# Patient Record
Sex: Male | Born: 1980 | Race: Black or African American | Hispanic: No | Marital: Married | Smoking: Never smoker
Health system: Southern US, Community
[De-identification: ages and names within clinical notes are randomized; demographics above are authoritative.]

---

## 2009-12-12 ENCOUNTER — Emergency Department (HOSPITAL_COMMUNITY): Admission: EM | Admit: 2009-12-12 | Discharge: 2009-12-12 | Payer: Self-pay | Admitting: Emergency Medicine

## 2010-09-23 ENCOUNTER — Emergency Department (HOSPITAL_COMMUNITY)
Admission: EM | Admit: 2010-09-23 | Discharge: 2010-09-23 | Payer: Self-pay | Source: Home / Self Care | Admitting: Emergency Medicine

## 2014-11-15 ENCOUNTER — Encounter (HOSPITAL_COMMUNITY): Payer: Self-pay | Admitting: Emergency Medicine

## 2014-11-15 ENCOUNTER — Emergency Department (HOSPITAL_COMMUNITY)
Admission: EM | Admit: 2014-11-15 | Discharge: 2014-11-15 | Disposition: A | Discharge status: Home / Self Care | Payer: Self-pay | Attending: Emergency Medicine | Admitting: Emergency Medicine

## 2014-11-15 DIAGNOSIS — M62830 Muscle spasm of back: Secondary | ICD-10-CM | POA: Insufficient documentation

## 2014-11-15 DIAGNOSIS — S3992XA Unspecified injury of lower back, initial encounter: Secondary | ICD-10-CM | POA: Insufficient documentation

## 2014-11-15 DIAGNOSIS — Y92410 Unspecified street and highway as the place of occurrence of the external cause: Secondary | ICD-10-CM | POA: Insufficient documentation

## 2014-11-15 DIAGNOSIS — Y9389 Activity, other specified: Secondary | ICD-10-CM | POA: Insufficient documentation

## 2014-11-15 DIAGNOSIS — Y998 Other external cause status: Secondary | ICD-10-CM | POA: Insufficient documentation

## 2014-11-15 MED ORDER — NAPROXEN 500 MG PO TABS: 500.0000 mg | ORAL_TABLET | Freq: Two times a day (BID) | ORAL | Status: DC

## 2014-11-15 MED ORDER — METHOCARBAMOL 500 MG PO TABS: 1000.0000 mg | ORAL_TABLET | Freq: Four times a day (QID) | ORAL | Status: DC

## 2014-11-15 NOTE — Discharge Instructions (Signed)
Please read and follow all provided instructions.  Your diagnoses today include:  1. Spasm of back muscles     Tests performed today include:  Vital signs - see below for your results today  Medications prescribed:   Robaxin (methocarbamol) - muscle relaxer medication  DO NOT drive or perform any activities that require you to be awake and alert because this medicine can make you drowsy.    Naproxen - anti-inflammatory pain medication  Do not exceed 500mg  naproxen every 12 hours, take with food  You have been prescribed an anti-inflammatory medication or NSAID. Take with food. Take smallest effective dose for the shortest duration needed for your pain. Stop taking if you experience stomach pain or vomiting.   Take any prescribed medications only as directed.  Home care instructions:   Follow any educational materials contained in this packet  Please rest, use heat on your back for the next several days  Do not lift, push, pull anything more than 10 pounds for the next week  Follow-up instructions: Please follow-up with your primary care provider in the next 1 week for further evaluation of your symptoms.   Return instructions:  SEEK IMMEDIATE MEDICAL ATTENTION IF YOU HAVE:  New numbness, tingling, weakness, or problem with the use of your arms or legs  Severe back pain not relieved with medications  Loss control of your bowels or bladder  Increasing pain in any areas of the body (such as chest or abdominal pain)  Shortness of breath, dizziness, or fainting.   Worsening nausea (feeling sick to your stomach), vomiting, fever, or sweats  Any other emergent concerns regarding your health   Additional Information:  Your vital signs today were: BP 137/87 mmHg   Pulse 71   Temp(Src) 98.6 F (37 C) (Oral)   SpO2 97% If your blood pressure (BP) was elevated above 135/85 this visit, please have this repeated by your doctor within one month. --------------
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
## 2015-04-18 ENCOUNTER — Emergency Department (HOSPITAL_COMMUNITY)
Admission: EM | Admit: 2015-04-18 | Discharge: 2015-04-18 | Disposition: A | Discharge status: Home / Self Care | Payer: No Typology Code available for payment source | Attending: Emergency Medicine | Admitting: Emergency Medicine

## 2015-04-18 ENCOUNTER — Encounter (HOSPITAL_COMMUNITY): Payer: Self-pay | Admitting: Emergency Medicine

## 2015-04-18 DIAGNOSIS — M62838 Other muscle spasm: Secondary | ICD-10-CM

## 2015-04-18 DIAGNOSIS — S4991XA Unspecified injury of right shoulder and upper arm, initial encounter: Secondary | ICD-10-CM | POA: Insufficient documentation

## 2015-04-18 DIAGNOSIS — Y9389 Activity, other specified: Secondary | ICD-10-CM | POA: Diagnosis not present

## 2015-04-18 DIAGNOSIS — S29092A Other injury of muscle and tendon of back wall of thorax, initial encounter: Secondary | ICD-10-CM | POA: Insufficient documentation

## 2015-04-18 DIAGNOSIS — Y92410 Unspecified street and highway as the place of occurrence of the external cause: Secondary | ICD-10-CM | POA: Insufficient documentation

## 2015-04-18 DIAGNOSIS — S199XXA Unspecified injury of neck, initial encounter: Secondary | ICD-10-CM | POA: Diagnosis not present

## 2015-04-18 DIAGNOSIS — Y998 Other external cause status: Secondary | ICD-10-CM | POA: Diagnosis not present

## 2015-04-18 MED ORDER — IBUPROFEN 400 MG PO TABS
400.0000 mg | ORAL_TABLET | Freq: Once | ORAL | Status: AC
  Administered 2015-04-18: 400 mg via ORAL
  Filled 2015-04-18: qty 1

## 2015-04-18 MED ORDER — METHOCARBAMOL 500 MG PO TABS: 1000.0000 mg | ORAL_TABLET | Freq: Four times a day (QID) | ORAL | Status: DC | PRN
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
## 2015-04-18 NOTE — Discharge Instructions (Signed)
For pain control you may take up to 800mg  of Motrin (also known as ibuprofen). That is usually 4 over the counter pills,  3 times a day. Take with food to minimize stomach irritation   For breakthrough pain you may take Robaxin. Do not drink alcohol, drive or operate heavy machinery when taking Robaxin.  Do not hesitate to return to the emergency room for any new, worsening or concerning symptoms.  Please obtain primary care using resource guide below. Let them know that you were seen in the emergency room and that they will need to obtain records for further outpatient management.    Emergency Department Resource Guide 1) Find a Doctor and Pay Out of Pocket Although you won't have to find out who is covered by your insurance plan, it is a good idea to ask around and get recommendations. You will then need to call the office and see if the doctor you have chosen will accept you as a new patient and what types of options they offer for patients who are self-pay. Some doctors offer discounts or will set up payment plans for their patients who do not have insurance, but you will need to ask so you aren't surprised when you get to your appointment.  2) Contact Your Local Health Department Not all health departments have doctors that can see patients for sick visits, but many do, so it is worth a call to see if yours does. If you don't know where your local health department is, you can check in your phone book. The CDC also has a tool to help you locate your state's health department, and many state websites also have listings of all of their local health departments.  3) Find a Walk-in Clinic If your illness is not likely to be very severe or complicated, you may want to try a walk in clinic. These are popping up all over the country in pharmacies, drugstores, and shopping centers. They're usually staffed by nurse practitioners or physician assistants that have been trained to treat common illnesses  and complaints. They're usually fairly quick and inexpensive. However, if you have serious medical issues or chronic medical problems, these are probably not your best option.  No Primary Care Doctor: - Call Health Connect at  [phone] - they can help you locate a primary care doctor that  accepts your insurance, provides certain services, etc. - Physician Referral Service- [phone]  Chronic Pain Problems: Organization         Address  Phone   Notes  [facility]  [phone] Patients need to be referred by their primary care doctor.   Medication Assistance: Organization         Address  Phone   Notes  [facility] [address] [place], [place] [address] [phone] --Must be a resident of [place] -- Must have NO insurance coverage whatsoever (no Medicaid/ Medicare, etc.) -- The pt. MUST have a primary care doctor that directs their care regularly and follows them in the community   MedAssist  [phone]   [employer]  [phone]    Agencies that provide inexpensive medical care: Organization         Address  Phone   Notes  [facility]  [phone]   [facility]    [phone]   [facility] [address] [place], [place] [address] [phone]  [facility] [address] [place] [phone]   Planned Parenthood    [phone]   [facility]    [phone]   [place] and [place] [address], [place] Phone:  [phone], Fax:  [phone] Hours of Operation:  9 am - 6 pm, M-F.  Also accepts Medicaid/Medicare and self-pay.  [facility] for [place] [address], [address], [place] Phone: [phone], Fax: [phone]. Hours of Operation:  8:30 am - 5:30 pm, M-F.  Also accepts Medicaid and self-pay.  [facility] [place] [address] [place] Phone: [phone]   [place], [place], [place] [phone], Ext. [phone] Mondays & Thursdays: 7-9 AM.  First 15 patients are seen on a first come, first serve basis.    [place] Providers:  Organization         Address  Phone   Notes  [facility] [address], [place] [phone] Also accepts self-pay patients.  [facility] [address], [place]  [phone]   [place], [address], [place] [phone]   [facility] [address], [place] [phone]   Lucianne Lei [address], [place]   [phone] Only accepts [place] Access [place] patients after they have their name applied to their card.   Self-Pay (no insurance) in [facility] [place]:  Organization         Address  Phone   Notes  Sickle Cell Patients, [facility] [place] [phone]   [facility] [place] [phone]   [facility] [place]  [place], [place], [place] [phone]   [facility]/Dr. Osei-Bonsu  [address] [place] or [address], [place] [phone] Phone number for both [place] and [place] locations is the same.  Urgent Medical and [facility] [address] [place] [phone]   [facility] [address] [place] or [address] [phone]   [facility] [address], [place] [phone], phone; [phone], fax Sees patients 1st and 3rd Saturday of every month.  Must not qualify for public or private insurance (i.e. Medicaid, Medicare, [place] Health Choice, Veterans' Benefits)  Household income should be no more than 200% of the poverty level  The clinic cannot treat you if you are pregnant or think you are pregnant  Sexually transmitted diseases are not treated at the clinic.    Dental Care: Organization         Address  Phone  Notes  [facility] [place] [phone] Accepts children up to age 97 who are enrolled in [place] or [place]; pregnant women with a Medicaid card; and children who have applied for Medicaid or [place] Health Choice, but were declined, whose parents can pay a reduced fee at time of service.  [facility]  [address], [place] [phone] Accepts children up to age 11 who are enrolled in [place] or [place]; pregnant women with a Medicaid card; and  children who have applied for Medicaid or [place] Health Choice, but were declined, whose parents can pay a reduced fee at time of service.  [place] Adult [facility] Access PROGRAM  [place] [phone] Patients are seen by appointment only. Walk-ins are not accepted. [place] will see patients 65 years of age and older. Monday - Tuesday (8am-5pm) Most Wednesdays (8:30-5pm) $30 per visit, cash only  [facility] Access PRO[facility]  [address], [facility] [phone] Patients are seen by appointment only. Walk-ins are not accepted. [place] will see patients 17 years of age and older. One Wednesday Evening (Monthly: Volunteer Based).  $30 per visit, cash only  [place]  [phone] for adults; Children under age 53, call Graduate Pediatric Dentistry at [phone]. Children aged 31-14, please call [phone] to request a pediatric application.  Dental services are provided in all areas of dental care including fillings, crowns and bridges, complete and partial dentures, implants, gum treatment, root canals, and extractions. Preventive care is  also provided. Treatment is provided to both adults and children. Patients are selected via a lottery and there is often a waiting list.   [facility] [address] [place]  [phone] [email or web address]   [facility] [address] [place], [place] [phone], Ext. [phone] Second and Fourth Thursday of each month, opens at 6:30 AM; Clinic ends at 9 AM.  Patients are seen on a first-come first-served basis, and a limited number are seen during each clinic.   [facility]  [address] [place] D[place]er [place], [place] [phone]   Eligibility Requirements You must have lived in [place], [place], or [place] counties for at least the last three months.   You cannot be eligible for state or federal sponsored [employer], including [employer], [place], or [employer].   You generally cannot be eligible for healthcare insurance through your employer.    How to apply: Eligibility screenings are held every Tuesday and Wednesday afternoon from 1:00 pm until 4:00 pm. You do not need an appointment for the interview!  [facility] [address] [place], [place]   [place]  [place] Department  [place]  [phone]    Behavioral Health Resources in the Community: Intensive Outpatient Programs Organization         Address  Phone  Notes  [place]. [address] [place], [place] [phone]   [facility]patient [address], [place], [place]   ADS: Alcohol & Drug Svcs [address] [place], [place]   [place] [address],  [place], [place] or [phone]   Substance Abuse Resources Organization         Address  Phone  Notes  Alcohol and Drug Services  [phone]   [place]  [phone]   The [place]   Chinita Pester  [phone]   Residential & Outpatient Substance Abuse Program  [phone]   Psychological Services Organization         Address  Phone  Notes  [facility] [place]  [place]  [phone]   [place] [address], [place] or [phone]    Mobile Crisis Teams Organization  Address  Phone  Notes  [facility], Mobile Crisis Care Unit  [phone]   [facility]  [address] [place], [place] [phone]   [facility] De[facility] [address] [place] [phone]    Self-Help/Support Groups Organization         Address  Phone             Notes  [facility] of [place] - variety of support groups  [phone] Call for more information  Narcotics Anonymous (NA), [facility]aring [facility] [address], [place]  2 meetings at this location   Statisticianesidential Treatment Programs Organization         Address  Phone  Notes  [facility] [address],    [place]  [phone]   [facility]  [address], [place] [phone], [place], [place] [phone]   [facility] [address], [place] [phone] Admissions: 8am-3pm M-F  [facility] [address]    [place], [place] [phone]   [facility] [address], [place], [place] [phone]   [facility] [address]  [place], [place] [phone]   [facility] - Intensive Outpatient [address], [place], [place] [phone]   [facility] ([facility] Recovery [facility] [address]  [place], [place] [phone] or [phone]   [facility] (RTS) [address] [place], [place] [phone] Accepts Medicaid  [facility] [place] [address]  [place] [phone]  Substance Abuse/Addiction Treatment   [facility] Behavioral Health Resources Organization         Address  Phone  Notes  [facility]  [phone]) [phone]   Angie FavaJulie Brannon, PhD [address] [place], [place]   [phone] or [phone]) [phone]   [facility] [place]   [address] [place], [place] [phone](336) [phone]   [facility] [address], [place], [place] [phone]) [phone] Insurance/Medicaid/sponsorship through [facility]  [facility] Families [address]                                    [place], [place] [phone]) [phone] Therapy/tele-psych/case  [facility] [address].   [place], [place] [phone]) [phone]    Dr. Lolly MustacheArfeen  [phone]) [phone]   [facility] [place]  United Way [facility] 1) [address], [place] 2) [address], [place] 3)  [address], [place] [phone]  [phone](336) [phone]   [facility] Child Abuse Hotline [phone]) [phone] or [phone]) [phone] (After Hours)

## 2018-05-10 ENCOUNTER — Ambulatory Visit (HOSPITAL_COMMUNITY)
Admission: EM | Admit: 2018-05-10 | Discharge: 2018-05-10 | Disposition: A | Discharge status: Home / Self Care | Payer: Self-pay

## 2018-05-11 ENCOUNTER — Ambulatory Visit (HOSPITAL_COMMUNITY)
Admission: EM | Admit: 2018-05-11 | Discharge: 2018-05-11 | Disposition: A | Discharge status: Home / Self Care | Payer: Self-pay | Attending: Family Medicine | Admitting: Family Medicine

## 2018-05-11 ENCOUNTER — Encounter (HOSPITAL_COMMUNITY): Payer: Self-pay | Admitting: Emergency Medicine

## 2018-05-11 ENCOUNTER — Ambulatory Visit (INDEPENDENT_AMBULATORY_CARE_PROVIDER_SITE_OTHER): Payer: Self-pay

## 2018-05-11 DIAGNOSIS — M25561 Pain in right knee: Secondary | ICD-10-CM

## 2018-05-11 DIAGNOSIS — K0889 Other specified disorders of teeth and supporting structures: Secondary | ICD-10-CM

## 2018-05-11 MED ORDER — DICLOFENAC SODIUM 75 MG PO TBEC: 75.0000 mg | DELAYED_RELEASE_TABLET | Freq: Two times a day (BID) | ORAL | 0 refills | Status: DC

## 2018-05-11 MED ORDER — AMOXICILLIN 875 MG PO TABS: 875.0000 mg | ORAL_TABLET | Freq: Two times a day (BID) | ORAL | 0 refills | Status: AC

## 2018-05-11 NOTE — ED Triage Notes (Signed)
Pt states he fell off a ladder and landed hard on his R leg, pt c/o R shin, knee pain. Ambulatory.

## 2018-05-11 NOTE — ED Provider Notes (Signed)
[facility]   [id number] 05/11/18 Arrival Time: 1002  ASSESSMENT & PLAN:  1. Acute pain of right knee     Imaging: Dg Knee Complete 4 Views Right  Result Date: 05/11/2018 CLINICAL DATA:  Acute RIGHT knee pain following fall 3 days ago. Initial encounter. EXAM: RIGHT KNEE - COMPLETE 4+ VIEW COMPARISON:  None. FINDINGS: No evidence of fracture, dislocation, or joint effusion. No evidence of arthropathy or other focal bone abnormality. Soft tissues are unremarkable. IMPRESSION: Negative. Electronically Signed   By: Harmon Pier M.D.   On: 05/11/2018 10:48    Meds ordered this encounter  Medications  . diclofenac (VOLTAREN) 75 MG EC tablet    Sig: Take 1 tablet (75 mg total) by mouth 2 (two) times daily.    Dispense:  14 tablet    Refill:  0   ROM as tolerated. WBAT. Work note given.  Will see a dentist if dental pain not resolving with antibiotic.  Reviewed expectations re: course of current medical issues. Questions answered. Outlined signs and symptoms indicating need for more acute intervention. Patient verbalized understanding. After Visit Summary given.  SUBJECTIVE: History from: patient. Jose Espinoza is a 37 y.o. male who reports intermittent mild to moderate pain of his right lateral knee that is stable; described as aching without radiation; occasional sharp pains. Able to sleep through night but notices more pain when he bears weight in the morning. No feeling that knee is locking up or going to give out on him. Onset: abrupt, 3 days ago. Injury/trama: yes, reports slipping on ladder. Relieved by: resting. Worsened by: certain movements. Associated symptoms: none reported. Extremity sensation changes or weakness: none. Self treatment: occasional Tylenol and Advil with mild and temporary help. History of similar: no  Also reports intermittent R lower dental pain with occasional swelling and occasional drainage. "Starting to get sore again" over the past  few days. No drainage. Afebrile. Does not see dentist regularly. Normal PO intake. Advil helps some.  ROS: As per HPI.   OBJECTIVE:  Vitals:   05/11/18 1022  BP: 137/89  Pulse: (!) 55  Resp: 16  Temp: 99 F (37.2 C)  TempSrc: Oral  SpO2: 98%    General appearance: alert; no distress Teeth: some tenderness over R lower gum without area of fluctuance; no swelling Extremities: warm and well perfused; symmetrical with no gross deformities; localized tenderness over his right lateral knee with no swelling and no bruising; ROM: normal but with some discomfort; no knee instability or pops/clicks CV: normal extremity capillary refill Skin: warm and dry Neurologic: normal gait; normal symmetric reflexes in all extremities; normal sensation in all extremities Psychological: alert and cooperative; normal mood and affect  No Known Allergies   Social History   Socioeconomic History  . Marital status: Married    Spouse name: Not on file  . Number of children: Not on file  . Years of education: Not on file  . Highest education level: Not on file  Occupational History  . Not on file  Social Needs  . Financial resource strain: Not on file  . Food insecurity:    Worry: Not on file    Inability: Not on file  . Transportation needs:    Medical: Not on file    Non-medical: Not on file  Tobacco Use  . Smoking status: Never Smoker  Substance and Sexual Activity  . Alcohol use: Yes  . Drug use: No  . Sexual activity: Not on file  Lifestyle  .  Physical activity:    Days per week: Not on file    Minutes per session: Not on file  . Stress: Not on file  Relationships  . Social connections:    Talks on phone: Not on file    Gets together: Not on file    Attends religious service: Not on file    Active member of club or organization: Not on file    Attends meetings of clubs or organizations: Not on file    Relationship status: Not on file  . Intimate partner violence:    Fear of  current or ex partner: Not on file    Emotionally abused: Not on file    Physically abused: Not on file    Forced sexual activity: Not on file  Other Topics Concern  . Not on file  Social History Narrative  . Not on file   No family history on file. History reviewed. No pertinent surgical history.    Mardella Layman, MD 05/11/18 1112

## 2018-10-11 ENCOUNTER — Ambulatory Visit (HOSPITAL_COMMUNITY)
Admission: EM | Admit: 2018-10-11 | Discharge: 2018-10-11 | Disposition: A | Discharge status: Home / Self Care | Payer: Self-pay | Attending: Family Medicine | Admitting: Family Medicine

## 2018-10-11 ENCOUNTER — Encounter (HOSPITAL_COMMUNITY): Payer: Self-pay | Admitting: Emergency Medicine

## 2018-10-11 DIAGNOSIS — J40 Bronchitis, not specified as acute or chronic: Secondary | ICD-10-CM

## 2018-10-11 MED ORDER — PREDNISONE 50 MG PO TABS: 50.0000 mg | ORAL_TABLET | Freq: Every day | ORAL | 0 refills | Status: AC

## 2018-10-11 MED ORDER — AZITHROMYCIN 250 MG PO TABS: 250.0000 mg | ORAL_TABLET | Freq: Every day | ORAL | 0 refills | Status: DC

## 2018-10-11 NOTE — ED Provider Notes (Signed)
[facility]    CSN: [id number] Arrival date & time: 10/11/18  1033     History   Chief Complaint Chief Complaint  Patient presents with  . Cough    HPI Jose Espinoza is a 37 y.o. male.   37 year old male comes in for 2-week history of URI symptoms.  Has had productive cough, mild rhinorrhea/nasal congestion.  Has had cold sweats.  States when symptoms first started, had fever with T-max of 102, but has resolved for about a week.  He has felt dyspnea on exertion, with mild wheezing.  Denies chest pain, swelling of the legs, orthopnea.  OTC cold medication without relief.  Denies history of asthma.  Never smoker.     History reviewed. No pertinent past medical history.  There are no active problems to display for this patient.   History reviewed. No pertinent surgical history.     Home Medications    Prior to Admission medications   Medication Sig Start Date End Date Taking? Authorizing Provider  azithromycin (ZITHROMAX) 250 MG tablet Take 1 tablet (250 mg total) by mouth daily. Take first 2 tablets together, then 1 every day until finished. 10/11/18   Cathie Hoops, Devlyn Retter V, PA-C  diclofenac (VOLTAREN) 75 MG EC tablet Take 1 tablet (75 mg total) by mouth 2 (two) times daily. Patient not taking: Reported on 10/11/2018 05/11/18   Mardella Layman, MD  methocarbamol (ROBAXIN) 500 MG tablet Take 2 tablets (1,000 mg total) by mouth 4 (four) times daily as needed (Pain). Patient not taking: Reported on 05/11/2018 04/18/15   Pisciotta, Joni Reining, PA-C  predniSONE (DELTASONE) 50 MG tablet Take 1 tablet (50 mg total) by mouth daily. 10/11/18   Belinda Fisher, PA-C    Family History History reviewed. No pertinent family history.  Social History Social History   Tobacco Use  . Smoking status: Never Smoker  Substance Use Topics  . Alcohol use: Yes  . Drug use: No     Allergies   Patient has no known allergies.   Review of Systems Review of Systems  Reason unable to perform ROS:  See HPI as above.     Physical Exam Triage Vital Signs ED Triage Vitals  Enc Vitals Group     BP 10/11/18 1130 116/82     Pulse Rate 10/11/18 1130 97     Resp 10/11/18 1130 18     Temp 10/11/18 1130 98.3 F (36.8 C)     Temp Source 10/11/18 1130 Oral     SpO2 10/11/18 1130 98 %     Weight --      Height --      Head Circumference --      Peak Flow --      Pain Score 10/11/18 1132 6     Pain Loc --      Pain Edu? --      Excl. in GC? --    No data found.  Updated Vital Signs BP 116/82 (BP Location: Right Arm)   Pulse 97   Temp 98.3 F (36.8 C) (Oral)   Resp 18   SpO2 98%   Physical Exam  Constitutional: He is oriented to person, place, and time. He appears well-developed and well-nourished. No distress.  HENT:  Head: Normocephalic and atraumatic.  Right Ear: Tympanic membrane, external ear and ear canal normal. Tympanic membrane is not erythematous and not bulging.  Left Ear: Tympanic membrane, external ear and ear canal normal. Tympanic membrane is not erythematous and not  bulging.  Nose: Nose normal. Right sinus exhibits no maxillary sinus tenderness and no frontal sinus tenderness. Left sinus exhibits no maxillary sinus tenderness and no frontal sinus tenderness.  Mouth/Throat: Uvula is midline, oropharynx is clear and moist and mucous membranes are normal.  Eyes: Pupils are equal, round, and reactive to light. Conjunctivae are normal.  Neck: Normal range of motion. Neck supple.  Cardiovascular: Normal rate, regular rhythm and normal heart sounds. Exam reveals no gallop and no friction rub.  No murmur heard. Pulmonary/Chest: Effort normal. No accessory muscle usage. No respiratory distress.  Mild rhonchi on bilateral lower bases that resolved after cough.  Lymphadenopathy:    He has no cervical adenopathy.  Neurological: He is alert and oriented to person, place, and time.  Skin: Skin is warm and dry.  Psychiatric: He has a normal mood and affect. His behavior is  normal. Judgment normal.     UC Treatments / Results  Labs (all labs ordered are listed, but only abnormal results are displayed) Labs Reviewed - No data to display  EKG None  Radiology No results found.  Procedures Procedures (including critical care time)  Medications Ordered in UC Medications - No data to display  Initial Impression / Assessment and Plan / UC Course  I have reviewed the triage vital signs and the nursing notes.  Pertinent labs & imaging results that were available during my care of the patient were reviewed by me and considered in my medical decision making (see chart for details).    Will cover for bronchitis with prednisone and azithromycin.  Other symptomatic treatment discussed.  Push fluids.  Return precautions.  Patient expresses understanding and agrees to plan.  Final Clinical Impressions(s) / UC Diagnoses   Final diagnoses:  Bronchitis   ED Prescriptions    Medication Sig Dispense Auth. Provider   predniSONE (DELTASONE) 50 MG tablet Take 1 tablet (50 mg total) by mouth daily. 5 tablet Aizlynn Digilio V, PA-C   azithromycin (ZITHROMAX) 250 MG tablet Take 1 tablet (250 mg total) by mouth daily. Take first 2 tablets together, then 1 every day until finished. 6 tablet Threasa Alpha, PA-C 10/11/18 1215

## 2018-10-11 NOTE — Discharge Instructions (Signed)
Start azithromycin and prednisone as directed. Continue sudafed for nasal congestion/drainage. You can use over the counter nasal saline rinse such as neti pot and/or allergy medicine for nasal congestion. Keep hydrated, your urine should be clear to pale yellow in color. Tylenol/motrin for fever and pain. Monitor for any worsening of symptoms, chest pain, shortness of breath, wheezing, swelling of the throat, follow up for reevaluation.   For sore throat/cough try using a honey-based tea. Use 3 teaspoons of honey with juice squeezed from half lemon. Place shaved pieces of ginger into 1/2-1 cup of water and warm over stove top. Then mix the ingredients and repeat every 4 hours as needed.

## 2018-10-11 NOTE — ED Triage Notes (Signed)
Pt sts cough and URI sx x 2 weeks 

## 2019-04-11 IMAGING — DX DG KNEE COMPLETE 4+V*R*
4 series · 4 of 4 positions shown · non-contrast
Comparison: None.

CLINICAL DATA: Acute RIGHT knee pain following fall 3 days ago.
Initial encounter.

EXAM:
RIGHT KNEE - COMPLETE 4+ VIEW

[knee ap]
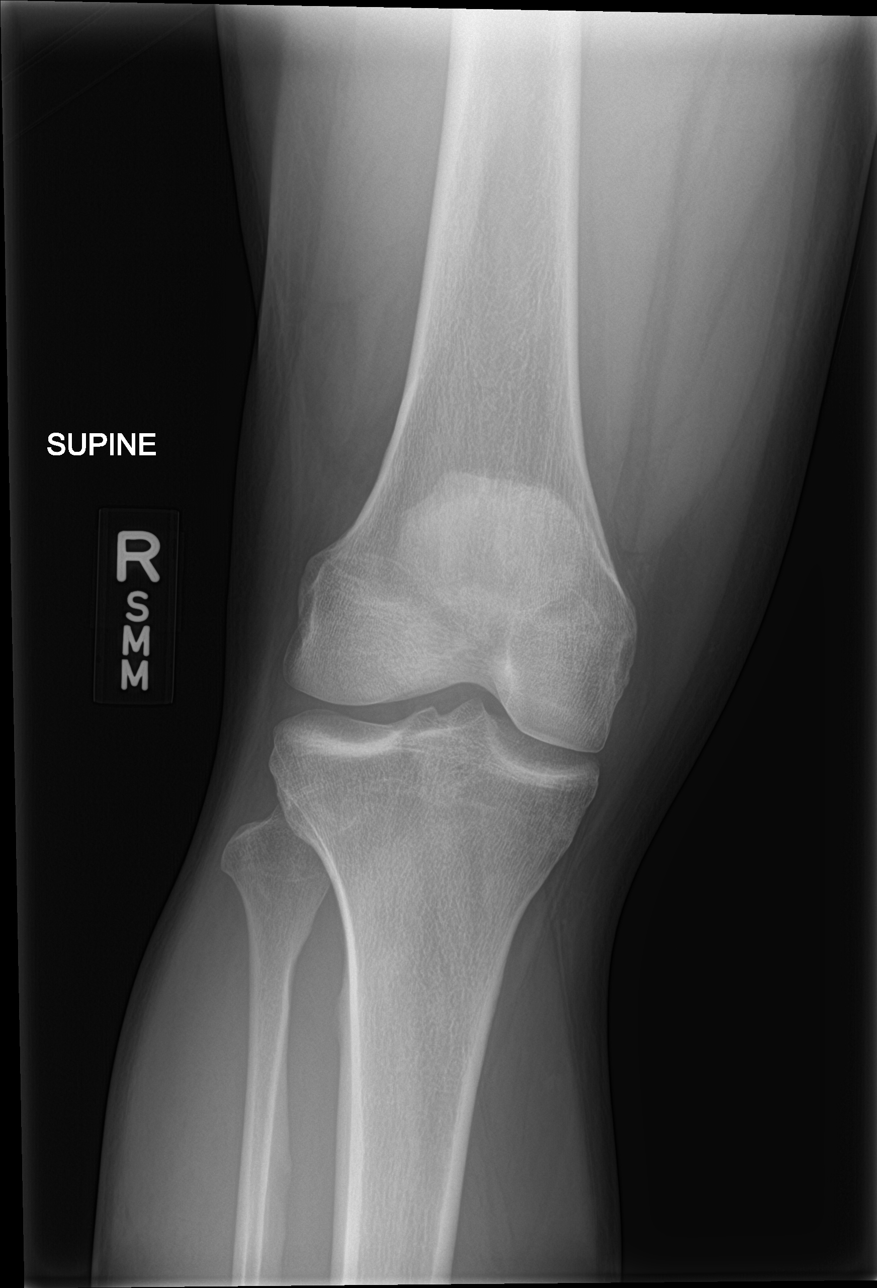

[knee obl (1 of 2)]
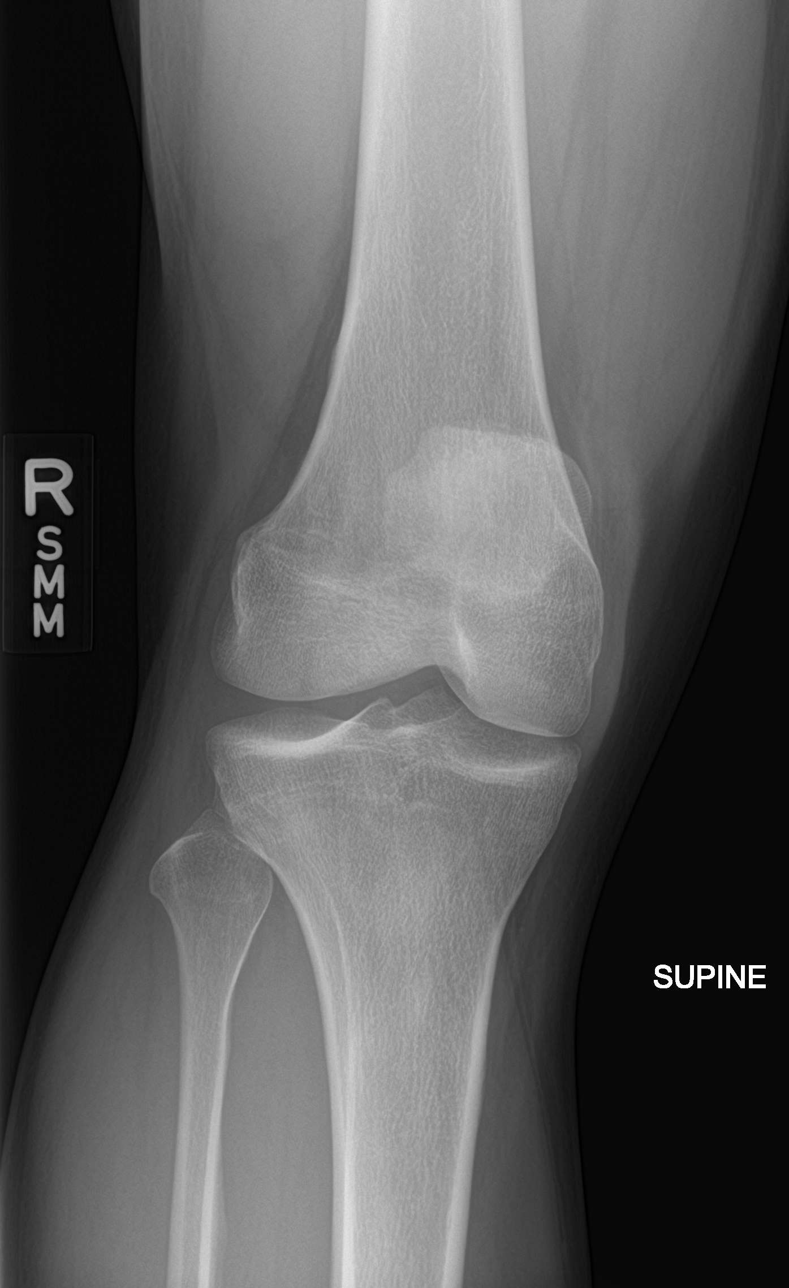

[knee obl (2 of 2)]
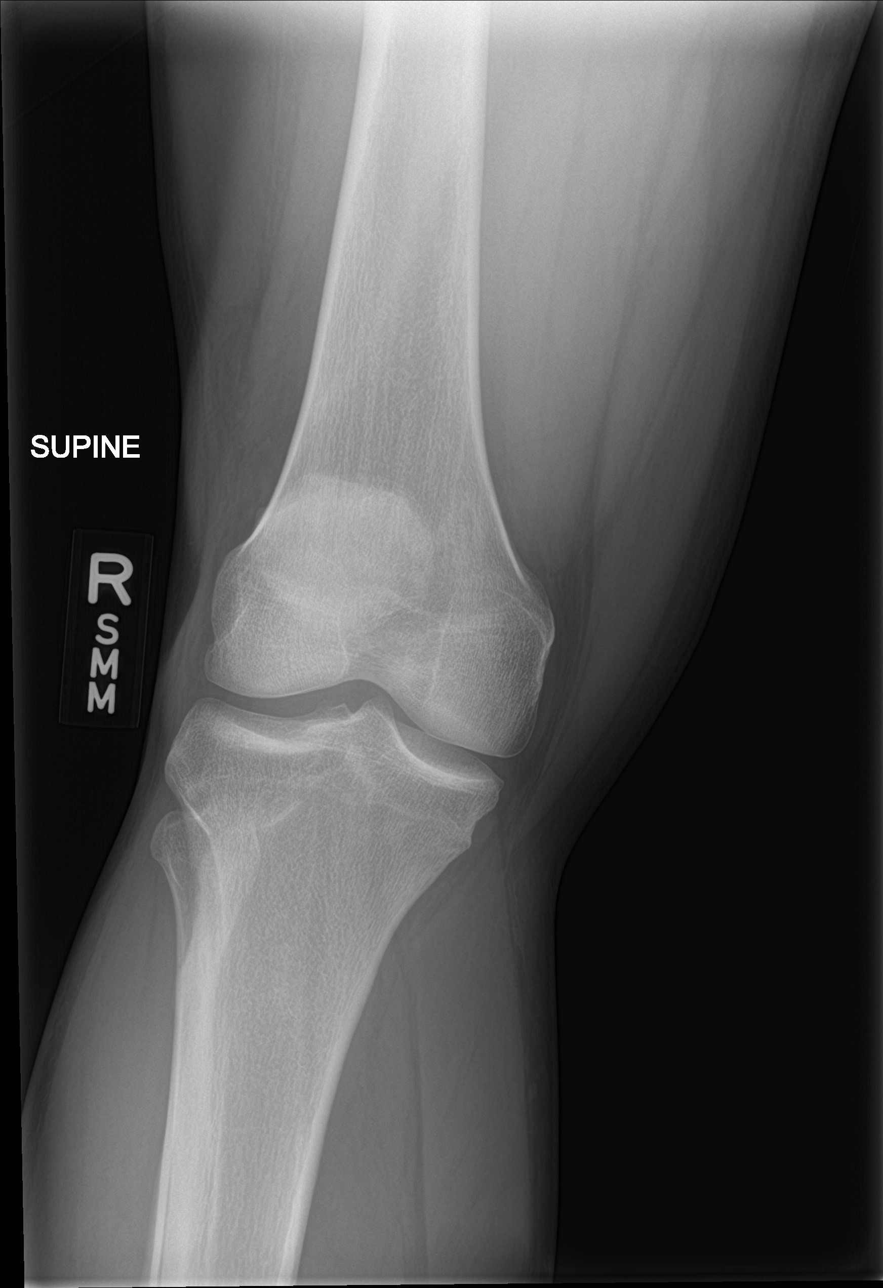

[knee lat]
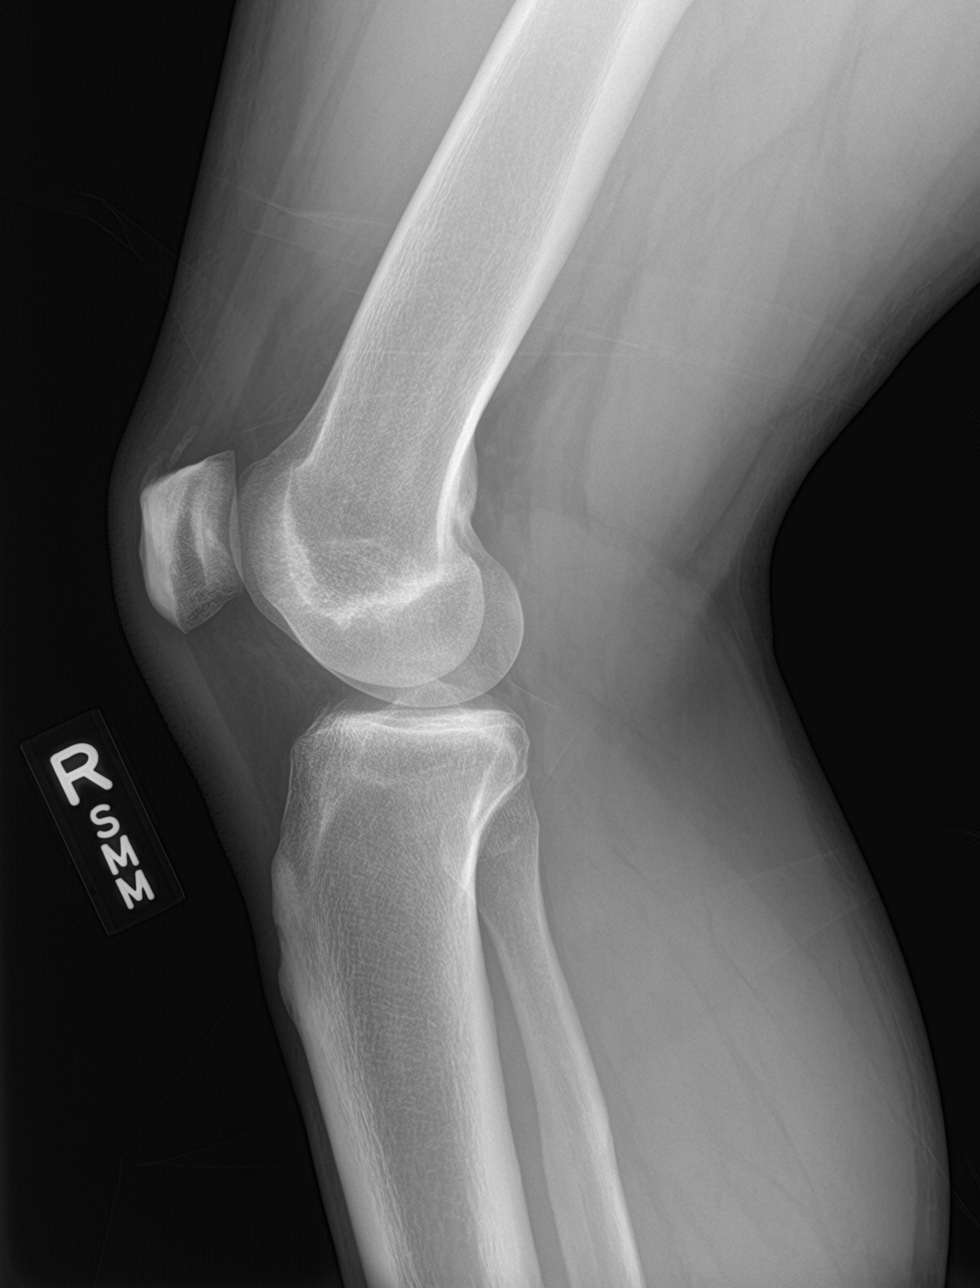

[4 of 4 positions shown; findings below may reference images not displayed]

FINDINGS: No evidence of fracture, dislocation, or joint effusion. No evidence
of arthropathy or other focal bone abnormality. Soft tissues are
unremarkable.
IMPRESSION: Negative.

## 2020-11-22 ENCOUNTER — Encounter (HOSPITAL_COMMUNITY): Payer: Self-pay | Admitting: Emergency Medicine

## 2020-11-22 ENCOUNTER — Other Ambulatory Visit: Payer: Self-pay

## 2020-11-22 ENCOUNTER — Ambulatory Visit (HOSPITAL_COMMUNITY)
Admission: EM | Admit: 2020-11-22 | Discharge: 2020-11-22 | Disposition: A | Discharge status: Home / Self Care | Payer: HRSA Program | Attending: Internal Medicine | Admitting: Internal Medicine

## 2020-11-22 ENCOUNTER — Ambulatory Visit (INDEPENDENT_AMBULATORY_CARE_PROVIDER_SITE_OTHER): Payer: HRSA Program

## 2020-11-22 DIAGNOSIS — Z791 Long term (current) use of non-steroidal anti-inflammatories (NSAID): Secondary | ICD-10-CM | POA: Diagnosis not present

## 2020-11-22 DIAGNOSIS — Z7952 Long term (current) use of systemic steroids: Secondary | ICD-10-CM | POA: Insufficient documentation

## 2020-11-22 DIAGNOSIS — R059 Cough, unspecified: Secondary | ICD-10-CM | POA: Diagnosis not present

## 2020-11-22 DIAGNOSIS — R52 Pain, unspecified: Secondary | ICD-10-CM | POA: Diagnosis not present

## 2020-11-22 DIAGNOSIS — R0602 Shortness of breath: Secondary | ICD-10-CM | POA: Insufficient documentation

## 2020-11-22 DIAGNOSIS — U071 COVID-19: Secondary | ICD-10-CM | POA: Diagnosis not present

## 2020-11-22 DIAGNOSIS — Z79899 Other long term (current) drug therapy: Secondary | ICD-10-CM | POA: Diagnosis not present

## 2020-11-22 DIAGNOSIS — J1289 Other viral pneumonia: Secondary | ICD-10-CM | POA: Diagnosis not present

## 2020-11-22 DIAGNOSIS — J189 Pneumonia, unspecified organism: Secondary | ICD-10-CM

## 2020-11-22 LAB — CBC WITH DIFFERENTIAL/PLATELET
Abs Immature Granulocytes: 0.05 10*3/uL (ref 0.00–0.07)
Basophils Absolute: 0 10*3/uL (ref 0.0–0.1)
Basophils Relative: 1 %
Eosinophils Absolute: 0 10*3/uL (ref 0.0–0.5)
Eosinophils Relative: 0 %
HCT: 46 % (ref 39.0–52.0)
Hemoglobin: 15.8 g/dL (ref 13.0–17.0)
Immature Granulocytes: 1 %
Lymphocytes Relative: 29 %
Lymphs Abs: 1.4 10*3/uL (ref 0.7–4.0)
MCH: 30.5 pg (ref 26.0–34.0)
MCHC: 34.3 g/dL (ref 30.0–36.0)
MCV: 88.8 fL (ref 80.0–100.0)
Monocytes Absolute: 0.5 10*3/uL (ref 0.1–1.0)
Monocytes Relative: 10 %
Neutro Abs: 3 10*3/uL (ref 1.7–7.7)
Neutrophils Relative %: 59 %
Platelets: 261 10*3/uL (ref 150–400)
RBC: 5.18 MIL/uL (ref 4.22–5.81)
RDW: 12.2 % (ref 11.5–15.5)
WBC: 5 10*3/uL (ref 4.0–10.5)
nRBC: 0 % (ref 0.0–0.2)

## 2020-11-22 LAB — BASIC METABOLIC PANEL
Anion gap: 12 (ref 5–15)
BUN: 11 mg/dL (ref 6–20)
CO2: 25 mmol/L (ref 22–32)
Calcium: 9 mg/dL (ref 8.9–10.3)
Chloride: 100 mmol/L (ref 98–111)
Creatinine, Ser: 1.31 mg/dL — ABNORMAL HIGH (ref 0.61–1.24)
GFR, Estimated: >60 mL/min (ref >60)
Glucose, Bld: 98 mg/dL (ref 70–99)
Potassium: 5.1 mmol/L (ref 3.5–5.1)
Sodium: 137 mmol/L (ref 135–145)

## 2020-11-22 LAB — RESP PANEL BY RT-PCR (FLU A&B, COVID) ARPGX2
Influenza A by PCR: NEGATIVE
Influenza B by PCR: NEGATIVE
SARS Coronavirus 2 by RT PCR: POSITIVE — AB

## 2020-11-22 MED ORDER — LEVOFLOXACIN 750 MG PO TABS: 750.0000 mg | ORAL_TABLET | Freq: Every day | ORAL | 0 refills | Status: AC

## 2020-11-22 NOTE — ED Provider Notes (Signed)
[facility]    CSN: [id number] Arrival date & time: 11/22/20  1312      History   Chief Complaint Chief Complaint  Patient presents with   Shortness of Breath    HPI Jose Espinoza is a 39 y.o. male comes to the urgent care with complaints of shortness of breath, cough productive of greenish sputum and generalized body aches of 4 days duration.  Patient symptoms started insidiously and has been persistent.  He denies any fever or chills.  No loss of taste or smell.  Patient's wife has similar symptoms.  Patient denies any wheezing but endorses shortness of breath both with rest and activity.  He is not vaccinated against COVID-19 virus.  No nausea or vomiting.   HPI  History reviewed. No pertinent past medical history.  There are no problems to display for this patient.   History reviewed. No pertinent surgical history.     Home Medications    Prior to Admission medications   Medication Sig Start Date End Date Taking? Authorizing Provider  levofloxacin (LEVAQUIN) 750 MG tablet Take 1 tablet (750 mg total) by mouth daily for 5 days. 11/22/20 11/27/20  Merrilee Jansky, MD  predniSONE (DELTASONE) 50 MG tablet Take 1 tablet (50 mg total) by mouth daily. 10/11/18   Belinda Fisher, PA-C    Family History History reviewed. No pertinent family history.  Social History Social History   Tobacco Use   Smoking status: Never Smoker   Smokeless tobacco: Never Used  Substance Use Topics   Alcohol use: Yes   Drug use: No     Allergies   Patient has no known allergies.   Review of Systems Review of Systems  Constitutional: Positive for fatigue. Negative for activity change, chills and fever.  HENT: Negative for sore throat.   Eyes: Negative.   Respiratory: Positive for cough and shortness of breath. Negative for chest tightness and wheezing.   Gastrointestinal: Negative.  Negative for diarrhea, nausea and vomiting.  Genitourinary: Negative.    Musculoskeletal: Negative.   Neurological: Negative.  Negative for dizziness, numbness and headaches.     Physical Exam Triage Vital Signs ED Triage Vitals  Enc Vitals Group     BP 11/22/20 1421 130/82     Pulse Rate 11/22/20 1421 (!) 102     Resp 11/22/20 1421 16     Temp 11/22/20 1421 98.3 F (36.8 C)     Temp Source 11/22/20 1421 Oral     SpO2 11/22/20 1421 98 %     Weight --      Height --      Head Circumference --      Peak Flow --      Pain Score 11/22/20 1419 6     Pain Loc --      Pain Edu? --      Excl. in GC? --    No data found.  Updated Vital Signs BP 130/82 (BP Location: Right Arm)    Pulse (!) 102    Temp 98.3 F (36.8 C) (Oral)    Resp 16    SpO2 98%   Visual Acuity Right Eye Distance:   Left Eye Distance:   Bilateral Distance:    Right Eye Near:   Left Eye Near:    Bilateral Near:     Physical Exam Vitals and nursing note reviewed.  Constitutional:      General: He is not in acute distress.    Appearance: He  is not ill-appearing.  Cardiovascular:     Rate and Rhythm: Normal rate and regular rhythm.  Pulmonary:     Effort: Pulmonary effort is normal.     Breath sounds: No decreased breath sounds, wheezing, rhonchi or rales.  Abdominal:     Palpations: Abdomen is soft. There is no hepatomegaly or mass.     Tenderness: There is no abdominal tenderness.  Neurological:     Mental Status: He is alert.      UC Treatments / Results  Labs (all labs ordered are listed, but only abnormal results are displayed) Labs Reviewed  RESP PANEL BY RT-PCR (FLU A&B, COVID) ARPGX2 - Abnormal; Notable for the following components:      Result Value   SARS Coronavirus 2 by RT PCR POSITIVE (*)    All other components within normal limits  BASIC METABOLIC PANEL - Abnormal; Notable for the following components:   Creatinine, Ser 1.31 (*)    All other components within normal limits  CBC WITH DIFFERENTIAL/PLATELET    EKG   Radiology DG Chest 2  View  Result Date: 11/22/2020 CLINICAL DATA:  Shortness of breath. EXAM: CHEST - 2 VIEW COMPARISON:  No pertinent prior exams available for comparison. FINDINGS: Heart size within normal limits. Shallow inspiration radiograph. Possible subtle patchy opacities within the perihilar right lung. The left lung is clear. No evidence of pleural effusion or pneumothorax. No acute bony abnormality identified. IMPRESSION: Shallow inspiration radiograph. Subtle patchy opacities are questioned within the perihilar right lung, possibly reflecting atypical/viral pneumonia. Clinical correlation is recommended. Electronically Signed   By: Jackey Loge DO   On: 11/22/2020 16:18    Procedures Procedures (including critical care time)  Medications Ordered in UC Medications - No data to display  Initial Impression / Assessment and Plan / UC Course  I have reviewed the triage vital signs and the nursing notes.  Pertinent labs & imaging results that were available during my care of the patient were reviewed by me and considered in my medical decision making (see chart for details).     1.  Shortness of breath worrisome for pneumonia: Chest x-ray shows acute infiltrates in the lungs bilaterally consistent for atypical pneumonia or viral pneumonia CBC, BMP Respiratory PCR for Covid/influenza A&B Levaquin 750 mg orally daily for 5 days Tylenol or Motrin as needed for fever and/or body aches If patient symptoms worsens he is advised to return to the urgent care to be reevaluated Patient is advised to increase oral fluid intake.   Final Clinical Impressions(s) / UC Diagnoses   Final diagnoses:  Community acquired pneumonia, unspecified laterality     Discharge Instructions     Medications as directed Tylenol/Motrin for pain and/or fever Return to urgent care if you have worsening symptoms We will call you with recommendations if your lab results are abnormal Please sign up for MyChart so you can view  your lab results.    ED Prescriptions    Medication Sig Dispense Auth. Provider   levofloxacin (LEVAQUIN) 750 MG tablet Take 1 tablet (750 mg total) by mouth daily for 5 days. 5 tablet Jrue Yambao, Britta Mccreedy, MD     PDMP not reviewed this encounter.   Merrilee Jansky, MD 11/23/20 [phone]

## 2020-11-22 NOTE — Discharge Instructions (Signed)
Medications as directed Tylenol/Motrin for pain and/or fever Return to urgent care if you have worsening symptoms We will call you with recommendations if your lab results are abnormal Please sign up for MyChart so you can view your lab results.

## 2020-11-22 NOTE — ED Triage Notes (Signed)
Patient c/o SOB x 4 days ago.   Patient endorses symptoms worsen upon exertion.   Patient endorses a productive cough w/ "greensih" sputum.   Patient endorses "soreness when coughing".

## 2020-11-23 ENCOUNTER — Telehealth: Payer: Self-pay | Admitting: Physician Assistant

## 2020-11-23 NOTE — Telephone Encounter (Addendum)
Called to discuss with patient about Covid symptoms and the use of sotrovimab, bamlanivimab/etesevimab or casirivimab/imdevimab, a monoclonal antibody infusion for those with mild to moderate Covid symptoms and at a high risk of hospitalization.  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  Cline Crock PA-C

## 2021-10-23 IMAGING — DX DG CHEST 2V
2 series · 2 of 2 positions shown · non-contrast
Comparison: No pertinent prior exams available for comparison.

CLINICAL DATA: Shortness of breath.

EXAM:
CHEST - 2 VIEW

[chest pa]
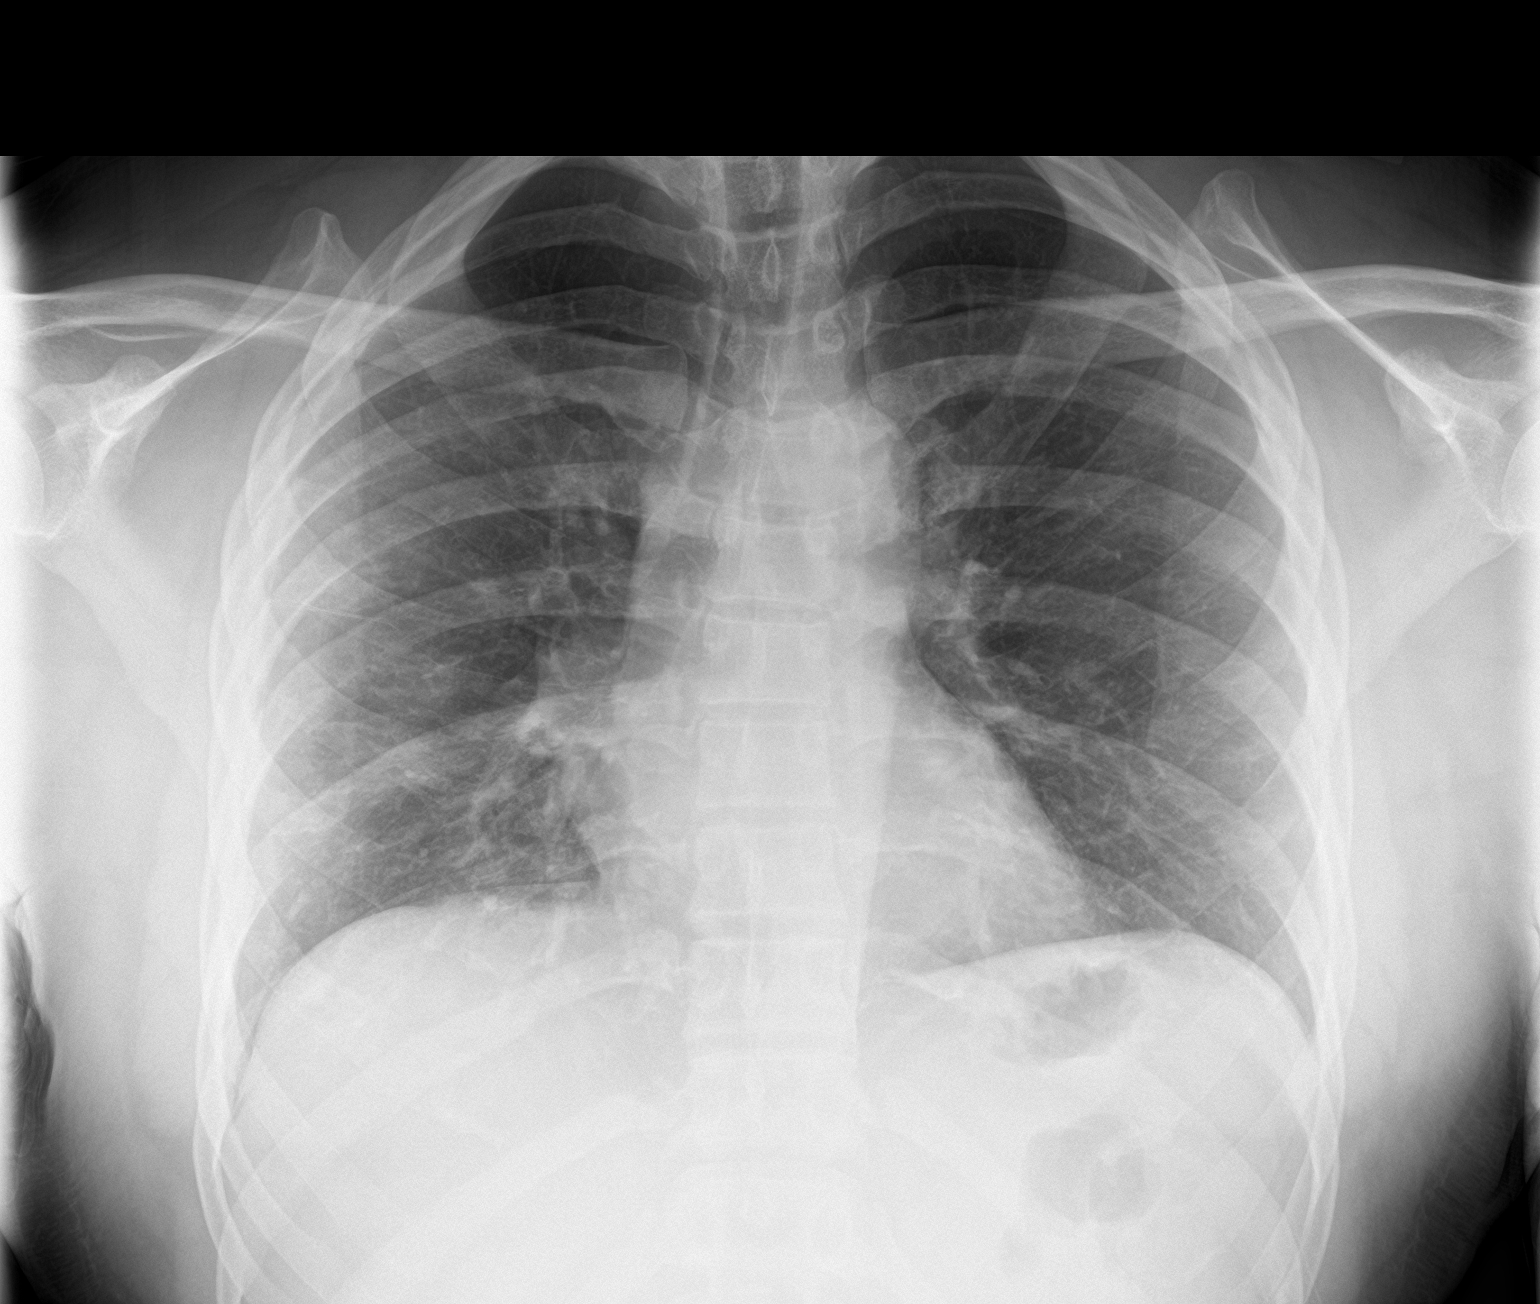

[chest lat]
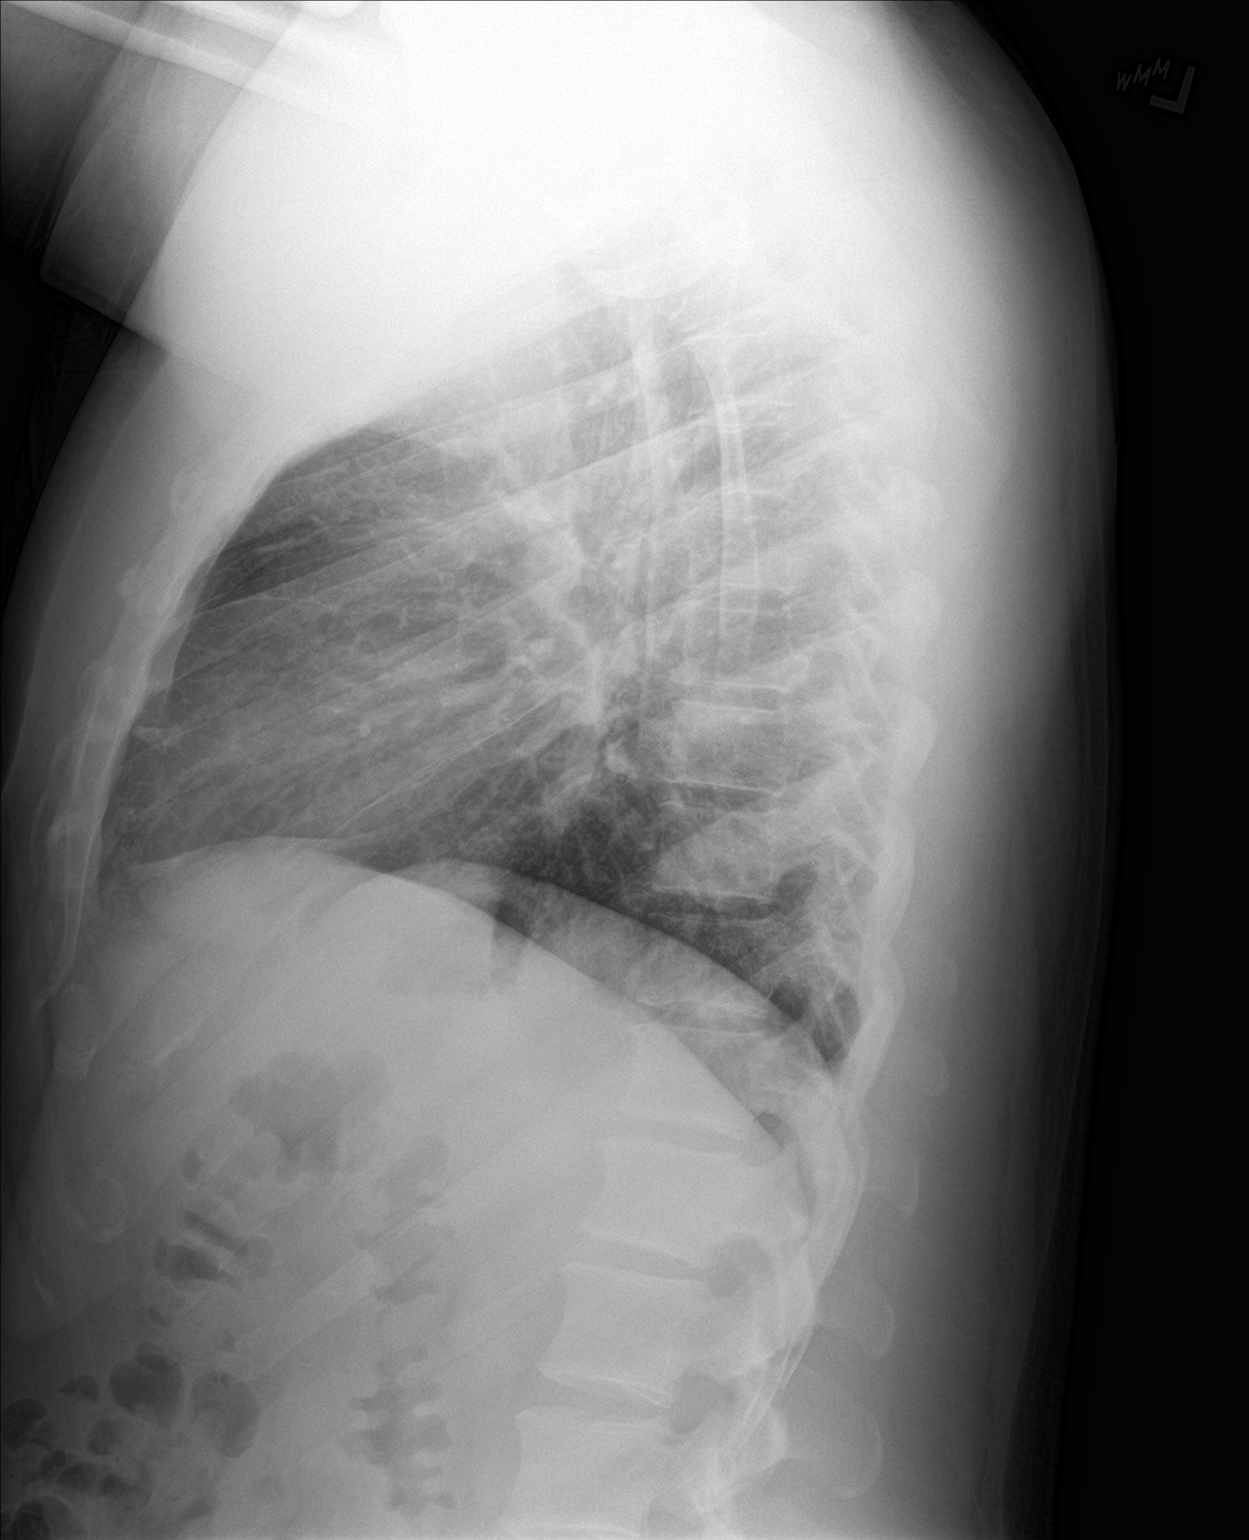

[2 of 2 positions shown; findings below may reference images not displayed]

FINDINGS: Heart size within normal limits. Shallow inspiration radiograph.
Possible subtle patchy opacities within the perihilar right lung.
The left lung is clear. No evidence of pleural effusion or
pneumothorax. No acute bony abnormality identified.
IMPRESSION: Shallow inspiration radiograph.

Subtle patchy opacities are questioned within the perihilar right
lung, possibly reflecting atypical/viral pneumonia. Clinical
correlation is recommended.
